# Patient Record
Sex: Male | Born: 1980 | Race: White | Hispanic: No | Marital: Single | Smoking: Former smoker
Health system: Southern US, Community
[De-identification: ages and names within clinical notes are randomized; demographics above are authoritative.]

## PROBLEM LIST (undated history)

## (undated) DIAGNOSIS — E785 Hyperlipidemia, unspecified: Secondary | ICD-10-CM

## (undated) DIAGNOSIS — I10 Essential (primary) hypertension: Secondary | ICD-10-CM

## (undated) DIAGNOSIS — E119 Type 2 diabetes mellitus without complications: Secondary | ICD-10-CM

## (undated) HISTORY — PX: WISDOM TOOTH EXTRACTION: SHX21

---

## 2012-04-23 ENCOUNTER — Emergency Department
Admission: EM | Admit: 2012-04-23 | Discharge: 2012-04-23 | Disposition: A | Discharge status: Home / Self Care | Payer: Self-pay | Source: Home / Self Care | Attending: Family Medicine | Admitting: Family Medicine

## 2012-04-23 ENCOUNTER — Encounter: Payer: Self-pay | Admitting: *Deleted

## 2012-04-23 ENCOUNTER — Emergency Department (INDEPENDENT_AMBULATORY_CARE_PROVIDER_SITE_OTHER): Payer: Self-pay

## 2012-04-23 DIAGNOSIS — S199XXA Unspecified injury of neck, initial encounter: Secondary | ICD-10-CM

## 2012-04-23 DIAGNOSIS — S139XXA Sprain of joints and ligaments of unspecified parts of neck, initial encounter: Secondary | ICD-10-CM

## 2012-04-23 DIAGNOSIS — M545 Low back pain: Secondary | ICD-10-CM

## 2012-04-23 DIAGNOSIS — M542 Cervicalgia: Secondary | ICD-10-CM

## 2012-04-23 HISTORY — DX: Essential (primary) hypertension: I10

## 2012-04-23 MED ORDER — HYDROCODONE-ACETAMINOPHEN 5-325 MG PO TABS: ORAL_TABLET | ORAL | Status: DC
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
## 2015-01-04 ENCOUNTER — Encounter: Payer: Self-pay | Admitting: *Deleted

## 2015-01-04 ENCOUNTER — Emergency Department
Admission: EM | Admit: 2015-01-04 | Discharge: 2015-01-04 | Disposition: A | Discharge status: Home / Self Care | Payer: BLUE CROSS/BLUE SHIELD | Source: Home / Self Care | Attending: Family Medicine | Admitting: Family Medicine

## 2015-01-04 DIAGNOSIS — J209 Acute bronchitis, unspecified: Secondary | ICD-10-CM

## 2015-01-04 HISTORY — DX: Hyperlipidemia, unspecified: E78.5

## 2015-01-04 HISTORY — DX: Type 2 diabetes mellitus without complications: E11.9

## 2015-01-04 MED ORDER — AZITHROMYCIN 250 MG PO TABS: ORAL_TABLET | ORAL | Status: DC

## 2015-01-04 MED ORDER — GUAIFENESIN-CODEINE 100-10 MG/5ML PO SOLN: ORAL | Status: DC

## 2015-01-04 NOTE — ED Provider Notes (Signed)
CSN: [id number]     Arrival date & time 01/04/15  1144 History   First MD Initiated Contact with Patient 01/04/15 1252     Chief Complaint  Patient presents with  . Cough      HPI Comments: About 12 days ago patient developed typical cold-like symptoms including mild sore throat, sinus congestion, fatigue, and cough.  During the past 2 to 3 days he has developed shortness of breath with activity and wheezing.  During the past 3 nights he has had fever to 100+ with night sweats.  No pleuritic pain.  The history is provided by the patient.    Past Medical History  Diagnosis Date  . Hypertension   . Hyperlipidemia   . Diabetes mellitus without complication (HCC)     diabetes type 1   Past Surgical History  Procedure Laterality Date  . Wisdom tooth extraction     Family History  Problem Relation Age of Onset  . Diabetes Mother   . Hypertension Mother   . Heart disease Mother   . Diabetes Father   . Hypertension Father   . Cancer Father     lung ca  . Diabetes Brother    Social History  Substance Use Topics  . Smoking status: Former Games developermoker  . Smokeless tobacco: None  . Alcohol Use: Yes    Review of Systems + sore throat + hoarse + cough No pleuritic pain + wheezing + nasal congestion + post-nasal drainage No sinus pain/pressure No itchy/red eyes No earache No hemoptysis + SOB + fever, + chills No nausea No vomiting No abdominal pain No diarrhea No urinary symptoms No skin rash + fatigue No myalgias No headache Used OTC meds without relief  Allergies  Aleve  Home Medications   Prior to Admission medications   Medication Sig Start Date End Date Taking? Authorizing Provider  amLODipine (NORVASC) 5 MG tablet Take 5 mg by mouth daily.   Yes Historical Provider, MD  atorvastatin (LIPITOR) 10 MG tablet Take 10 mg by mouth daily.   Yes Historical Provider, MD  benazepril (LOTENSIN) 40 MG tablet Take 40 mg by mouth daily.   Yes Historical Provider, MD   insulin aspart (NOVOLOG) 100 UNIT/ML injection Inject into the skin 3 (three) times daily before meals.   Yes Historical Provider, MD  insulin glargine (LANTUS) 100 UNIT/ML injection Inject into the skin at bedtime.   Yes Historical Provider, MD  azithromycin (ZITHROMAX Z-PAK) 250 MG tablet Take 2 tabs today; then begin one tab once daily for 4 more days. 01/04/15   Lattie HawStephen A Beese, MD  guaiFENesin-codeine 100-10 MG/5ML syrup Take 10mL by mouth at bedtime as needed for cough 01/04/15   Lattie HawStephen A Beese, MD   Meds Ordered and Administered this Visit  Medications - No data to display  BP 156/102 mmHg  Pulse 103  Temp(Src) 98.3 F (36.8 C) (Oral)  Resp 16  Ht 6' (1.829 m)  Wt 169 lb (76.658 kg)  BMI 22.92 kg/m2  SpO2 98% No data found.   Physical Exam Nursing notes and Vital Signs reviewed. Appearance:  Patient appears stated age, and in no acute distress Eyes:  Pupils are equal, round, and reactive to light and accomodation.  Extraocular movement is intact.  Conjunctivae are not inflamed  Ears:  Canals normal.  Tympanic membranes normal.  Nose:  Mildly congested turbinates.  No sinus tenderness.   Pharynx:  Normal Neck:  Supple.   Tender enlarged posterior nodes are palpated bilaterally  Lungs:  Clear to auscultation.  Breath sounds are equal.  Moving air well. Heart:  Regular rate and rhythm without murmurs, rubs, or gallops.  Rate 116 Abdomen:  Nontender without masses or hepatosplenomegaly.  Bowel sounds are present.  No CVA or flank tenderness.  Extremities:  No edema.  Skin:  No rash present.   ED Course  Procedures none   MDM   1. Acute bronchitis, unspecified organism    Begin Z-pak for atypical coverage. Rx for Robitussin AC for night time cough.  Take plain guaifenesin (1200mg  extended release tabs such as Mucinex) twice daily, with plenty of water, for cough and congestion.  Get adequate rest.   May use Afrin nasal spray (or generic oxymetazoline) twice daily for  about 5 days and then discontinue.  Also recommend using saline nasal spray several times daily and saline nasal irrigation (AYR is a common brand).  Try warm salt water gargles for sore throat.  Stop all antihistamines for now, and other non-prescription cough/cold preparations. Follow-up with family doctor if not improving about 7 to10 days.    Lattie Haw, MD 01/09/15 [phone]

## 2015-01-04 NOTE — ED Notes (Signed)
Pt c/o productive cough, sneezing, night sweats and temp up to 101.3 x 12 days.

## 2015-01-04 NOTE — Discharge Instructions (Signed)
Take plain guaifenesin (1200mg extended release tabs such as Mucinex) twice daily, with plenty of water, for cough and congestion.  Get adequate rest.   °May use Afrin nasal spray (or generic oxymetazoline) twice daily for about 5 days and then discontinue.  Also recommend using saline nasal spray several times daily and saline nasal irrigation (AYR is a common brand).  °Try warm salt water gargles for sore throat.  °Stop all antihistamines for now, and other non-prescription cough/cold preparations. °  °Follow-up with family doctor if not improving about 7 to10 days.  °

## 2015-01-10 ENCOUNTER — Telehealth: Payer: Self-pay | Admitting: Emergency Medicine

## 2015-03-03 ENCOUNTER — Encounter: Payer: Self-pay | Admitting: *Deleted

## 2015-03-03 ENCOUNTER — Emergency Department
Admission: EM | Admit: 2015-03-03 | Discharge: 2015-03-03 | Disposition: A | Discharge status: Home / Self Care | Payer: BLUE CROSS/BLUE SHIELD | Source: Home / Self Care | Attending: Family Medicine | Admitting: Family Medicine

## 2015-03-03 DIAGNOSIS — M545 Low back pain, unspecified: Secondary | ICD-10-CM

## 2015-03-03 MED ORDER — HYDROCODONE-ACETAMINOPHEN 5-325 MG PO TABS: 1.0000 | ORAL_TABLET | Freq: Four times a day (QID) | ORAL | Status: AC | PRN

## 2015-03-03 MED ORDER — PREDNISONE 20 MG PO TABS: ORAL_TABLET | ORAL | Status: AC

## 2015-03-03 MED ORDER — CYCLOBENZAPRINE HCL 10 MG PO TABS: 10.0000 mg | ORAL_TABLET | Freq: Two times a day (BID) | ORAL | Status: AC | PRN

## 2015-03-03 NOTE — Discharge Instructions (Signed)
You may take 600-800mg  Ibuprofen (Motrin) every 6-8 hours for pain.  Norco/Vicodin (hydrocodone-acetaminophen) is a narcotic pain medication, do not combine these medications with others containing tylenol. While taking, do not drink alcohol, drive, or perform any other activities that requires focus while taking these medications.   Flexeril is a muscle relaxer and may cause drowsiness. Do not drink alcohol, drive, or operate heavy machinery while taking.  You have been prescribed 3 days of prednisone, an oral steroid.  This medication will help with inflammation in the muscles in your back and help decrease the pain and muscle spasms.  You may start this medication today or tomorrow with breakfast.  You may notice an increase in your blood sugar levels, this is common, but they should return to normal once you stop taking the medication.

## 2015-03-03 NOTE — ED Provider Notes (Signed)
CSN: [id number]     Arrival date & time 03/03/15  8295 History   None    Chief Complaint  Patient presents with  . Back Pain   (Consider location/radiation/quality/duration/timing/severity/associated sxs/prior Treatment) HPI The pt is a 35yo male presenting to [facility] with c/o bilateral lower back pain that started about 2 days ago after he attempted to lift a large television by himself.  Pain is aching and sore, 7/10 at worst, worse with certain movements and in certain positions.  Pain does not radiate to his arms or legs. Denies change in bowel or bladder habits. Denies numbness or weakness of his legs.  He has been taking ibuprofen, last dose was  at 0630 this morning.  Reports hx of prior back injuries but no surgeries. He has had a muscle relaxer in the past for back pain but does not recall if it helped or not but is will to try it again. Denies hx of back surgeries.  Repots hx of Type 1 DM that is well controlled.    Past Medical History  Diagnosis Date  . Hypertension   . Hyperlipidemia   . Diabetes mellitus without complication (HCC)     diabetes type 1   Past Surgical History  Procedure Laterality Date  . Wisdom tooth extraction     Family History  Problem Relation Age of Onset  . Diabetes Mother   . Hypertension Mother   . Heart disease Mother   . Diabetes Father   . Hypertension Father   . Cancer Father     lung ca  . Diabetes Brother    Social History  Substance Use Topics  . Smoking status: Former Games developer  . Smokeless tobacco: None  . Alcohol Use: Yes    Review of Systems  Musculoskeletal: Positive for myalgias and back pain. Negative for arthralgias, gait problem, neck pain and neck stiffness.  Skin: Negative for color change, rash and wound.  Neurological: Negative for weakness and numbness.    Allergies  Aleve  Home Medications   Prior to Admission medications   Medication Sig Start Date End Date Taking? Authorizing Provider  amLODipine (NORVASC)  5 MG tablet Take 5 mg by mouth daily.    Historical Provider, MD  atorvastatin (LIPITOR) 10 MG tablet Take 10 mg by mouth daily.    Historical Provider, MD  benazepril (LOTENSIN) 40 MG tablet Take 40 mg by mouth daily.    Historical Provider, MD  cyclobenzaprine (FLEXERIL) 10 MG tablet Take 1 tablet (10 mg total) by mouth 2 (two) times daily as needed for muscle spasms. 03/03/15   Junius Finner, PA-C  HYDROcodone-acetaminophen (NORCO/VICODIN) 5-325 MG tablet Take 1 tablet by mouth every 6 (six) hours as needed for moderate pain or severe pain. 03/03/15   Junius Finner, PA-C  insulin aspart (NOVOLOG) 100 UNIT/ML injection Inject into the skin 3 (three) times daily before meals.    Historical Provider, MD  insulin glargine (LANTUS) 100 UNIT/ML injection Inject into the skin at bedtime.    Historical Provider, MD  predniSONE (DELTASONE) 20 MG tablet 2 tabs po daily x 3 days 03/03/15   Junius Finner, PA-C   Meds Ordered and Administered this Visit  Medications - No data to display  BP 155/94 mmHg  Pulse 78  Temp(Src) 98 F (36.7 C) (Oral)  Resp 16  Ht 6' (1.829 m)  Wt 177 lb (80.287 kg)  BMI 24.00 kg/m2  SpO2 100% No data found.   Physical Exam  Constitutional: He is  oriented to person, place, and time. He appears well-developed and well-nourished.  HENT:  Head: Normocephalic and atraumatic.  Eyes: EOM are normal.  Neck: Normal range of motion.  Cardiovascular: Normal rate.   Pulmonary/Chest: Effort normal.  Musculoskeletal: Normal range of motion. He exhibits tenderness. He exhibits no edema.  Right arm below elbow amputation.   No midline spinal tenderness. Tenderness to lower bilateral lumbar muscles.  Full ROM lower extremities with 5/5 strength. Negative straight leg raise.   Neurological: He is alert and oriented to person, place, and time.  Reflex Scores:      Patellar reflexes are 2+ on the right side and 2+ on the left side. Normal gait  Skin: Skin is warm and dry.   Psychiatric: He has a normal mood and affect. His behavior is normal.  Nursing note and vitals reviewed.   ED Course  Procedures (including critical care time)  Labs Review Labs Reviewed - No data to display  Imaging Review No results found.    MDM   1. Bilateral low back pain without sciatica    Pt c/o lower back pain after attempting to lift a large television by himself.  No red flag symptoms. No indication for imaging at this time.  Will treat for muscle strain.    Rx: Norco, Prednisone (  for 3 days, discussed possible increase in his CBG), Flexeril, and may continue to take 600-800mg  Ibuprofen every 6-8 hours for pain. Encouraged to alternate ice and heat for pain. Home care instructions with exercises provided to pt. F/u with Sports Medicine, Dr. Denyse Amass or Dr. Benjamin Stain in 1-2 weeks if not improving. Patient verbalized understanding and agreement with treatment plan. Pt declined work note.      Junius Finner, PA-C 03/03/15 1032

## 2015-03-03 NOTE — ED Notes (Signed)
Pt c/o LBP x 2 days after moving a TV at home. He took IBF  at 0630 today.

## 2015-03-06 ENCOUNTER — Telehealth: Payer: Self-pay | Admitting: Emergency Medicine

## 2017-08-02 DEATH — deceased
